# Patient Record
Sex: Female | Born: 1995 | Race: White | Hispanic: No | Marital: Single | Smoking: Never smoker
Health system: Southern US, Community
[De-identification: ages and names within clinical notes are randomized; demographics above are authoritative.]

---
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
## 2018-02-28 IMAGING — CR DG ELBOW COMPLETE 3+V*R*
5 series · 5 of 5 positions shown · non-contrast
Comparison: None

CLINICAL DATA: Injured arm playing rugby.

EXAM:
RIGHT ELBOW - COMPLETE 3+ VIEW

[x elbow ap right]
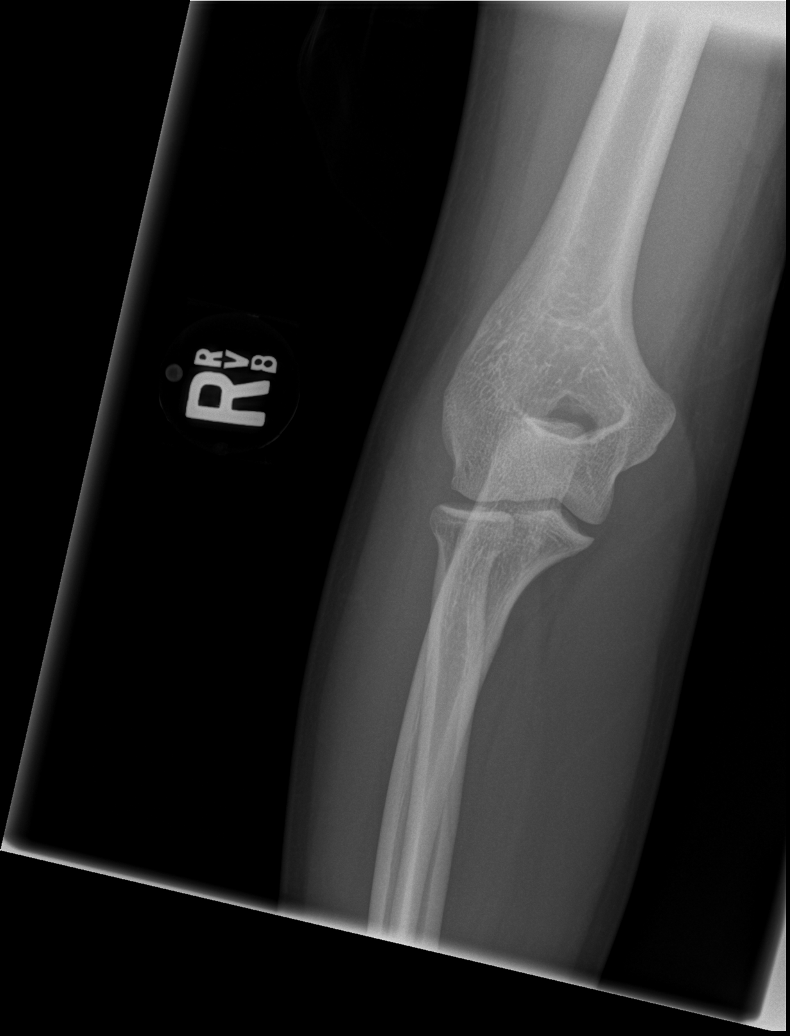

[x elbow obl right (1 of 2)]
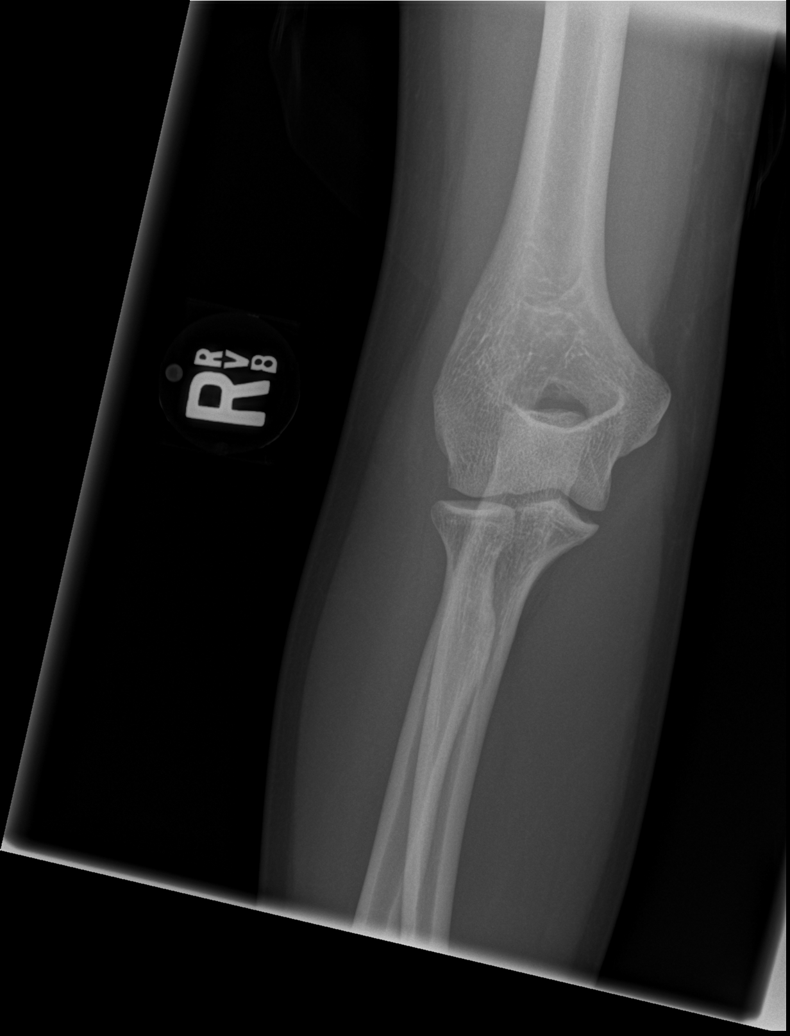

[x elbow obl right (2 of 2)]
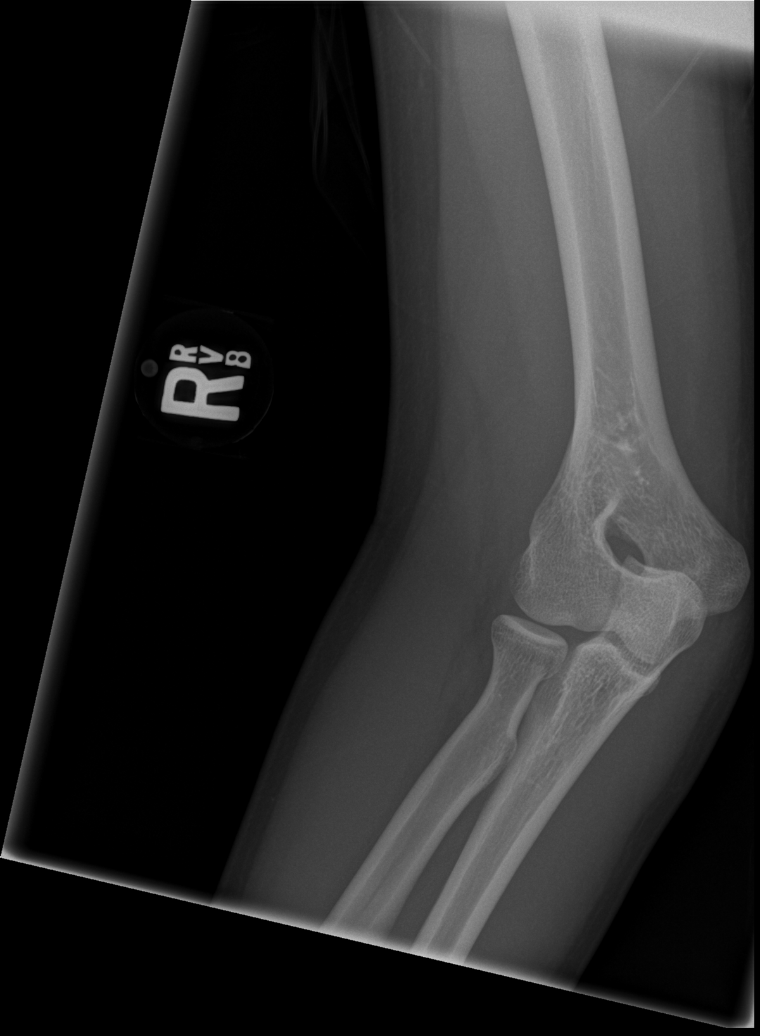

[x elbow lat right (1 of 2)]
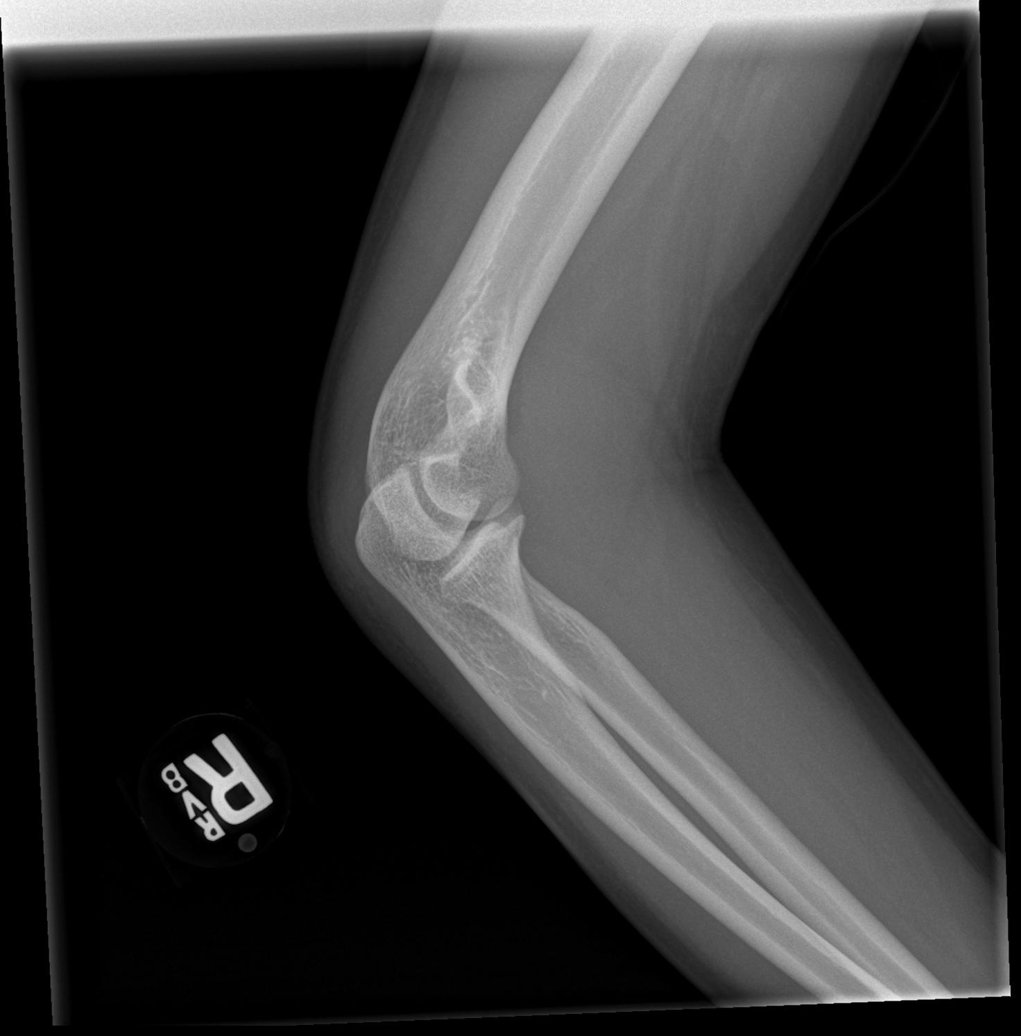

[x elbow lat right (2 of 2)]
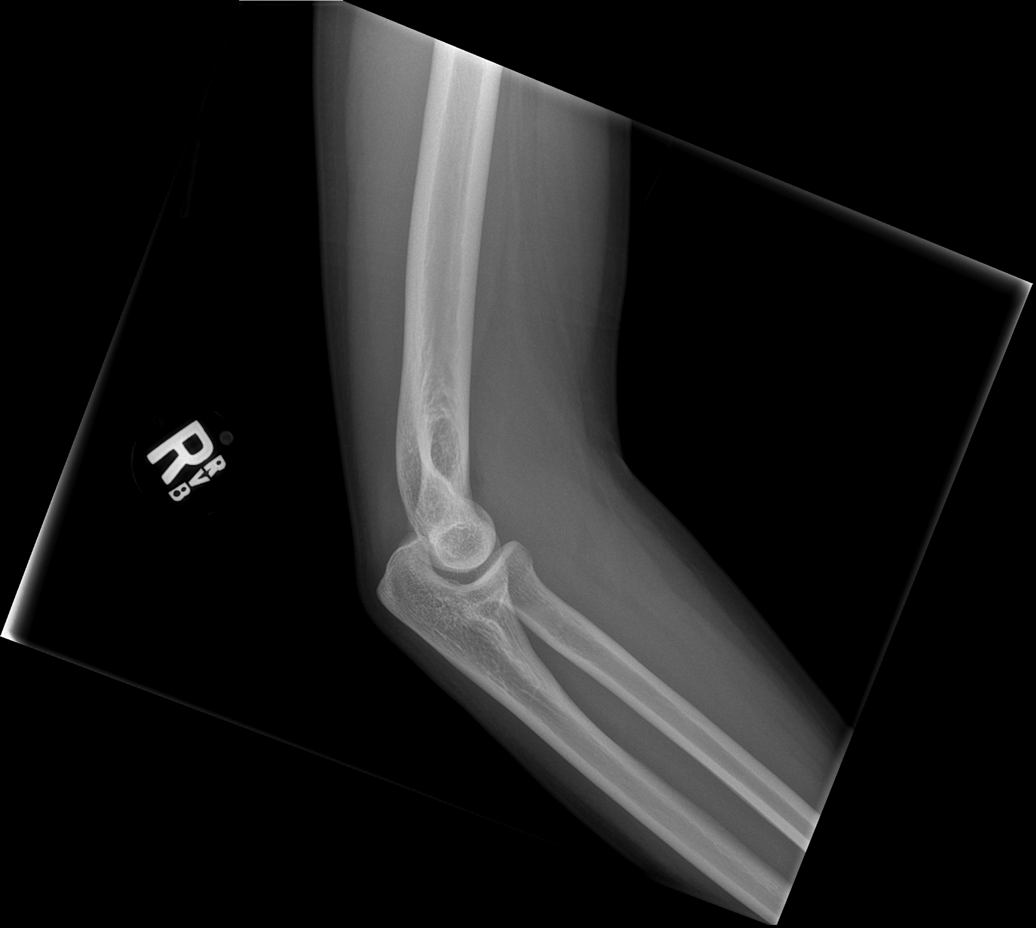

[5 of 5 positions shown; findings below may reference images not displayed]

FINDINGS: The joint spaces are maintained. No acute fracture or elbow joint
effusion.
IMPRESSION: No acute fracture or joint effusion.

## 2018-02-28 IMAGING — CR DG FOREARM 2V*R*
2 series · 2 of 2 positions shown · non-contrast
Comparison: None.

CLINICAL DATA: Injured forearm playing rugby.

EXAM:
RIGHT FOREARM - 2 VIEW

[x forearm ap right]
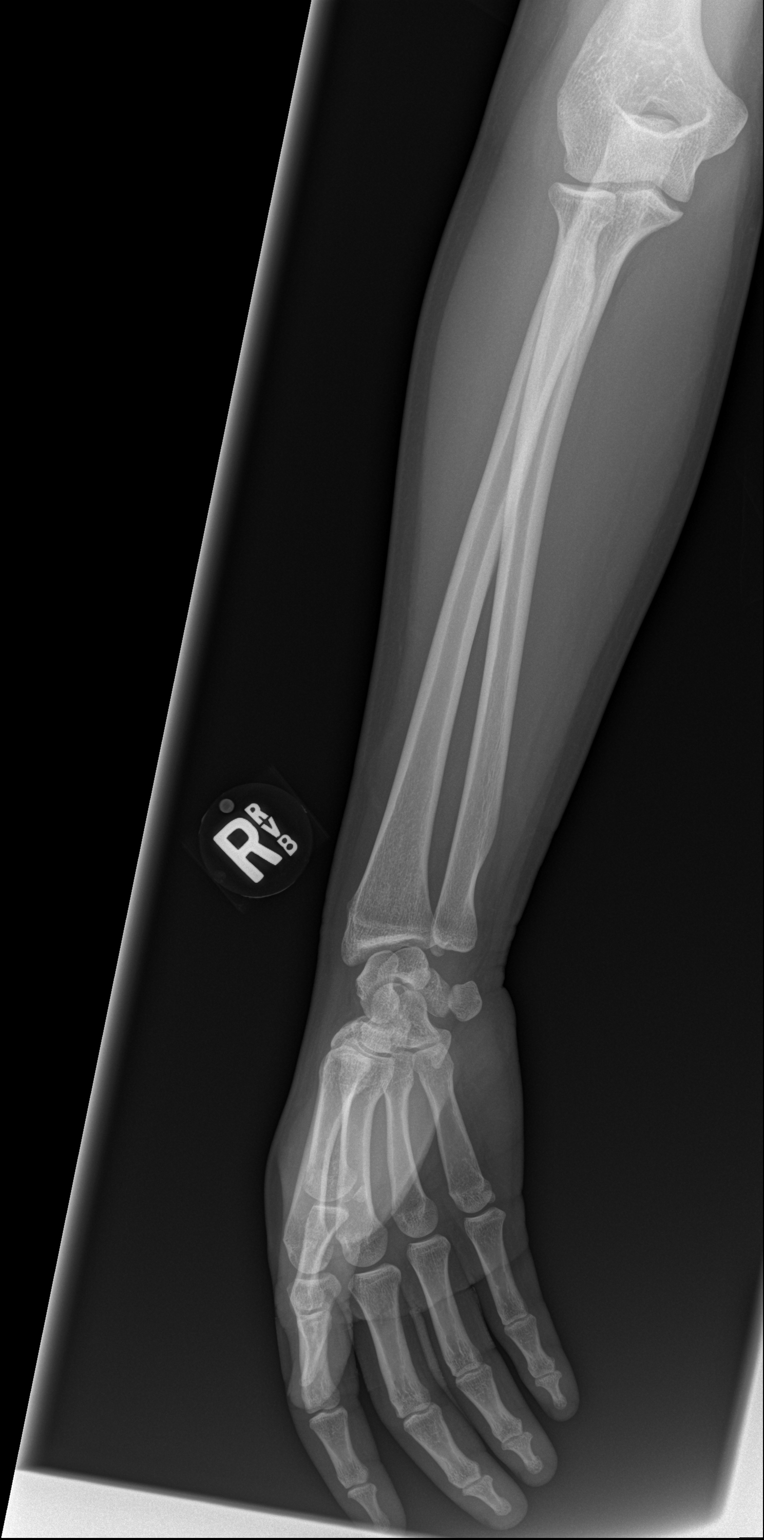

[x forearm lat right]
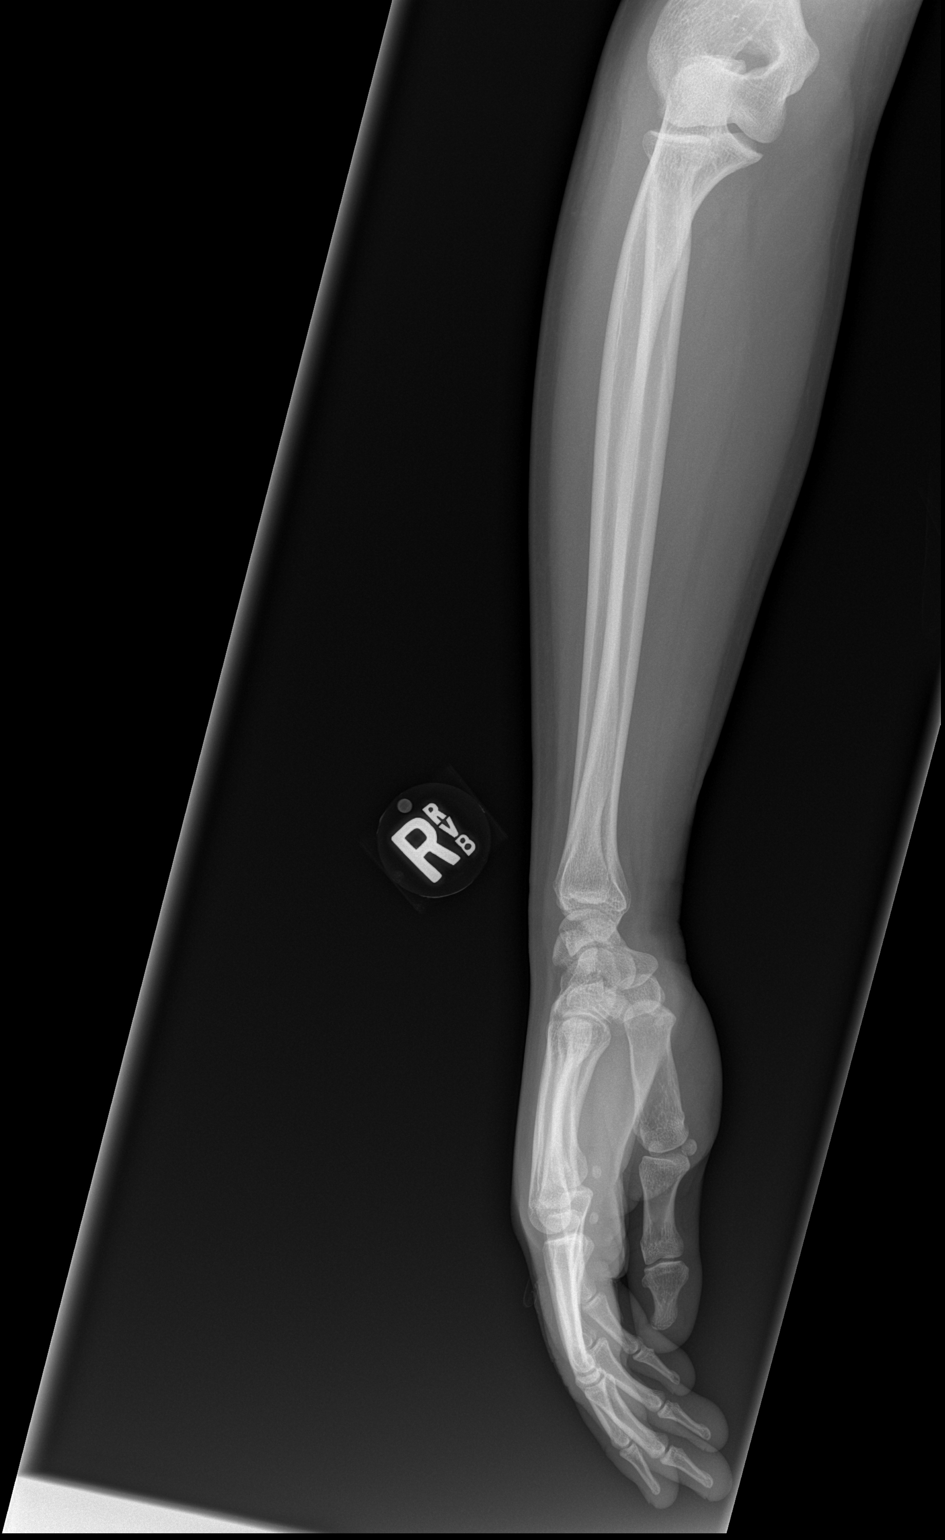

[2 of 2 positions shown; findings below may reference images not displayed]

FINDINGS: The wrist and elbow joints are maintained. No acute forearm
fracture.
IMPRESSION: No acute bony findings.
